# Patient Record
Sex: Male | Born: 1995 | Race: Asian | Hispanic: No | Marital: Single | Smoking: Never smoker
Health system: Southern US, Community
[De-identification: ages and names within clinical notes are randomized; demographics above are authoritative.]

---
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
## 2014-08-26 IMAGING — CR DG FOREARM 2V*R*
2 series · 2 of 2 positions shown · non-contrast
Comparison: None.

CLINICAL DATA: Extremity laceration. Evaluate for foreign body.

EXAM:
RIGHT FOREARM - 2 VIEW

[x forearm ap right]
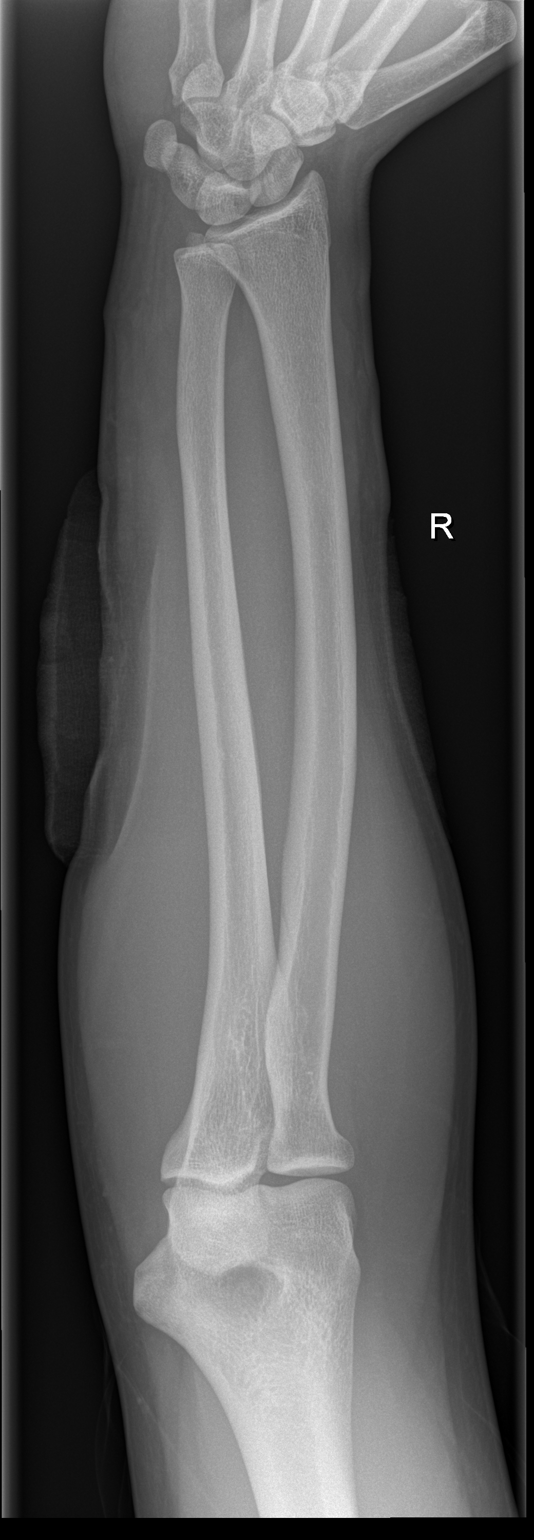

[x forearm lat right]
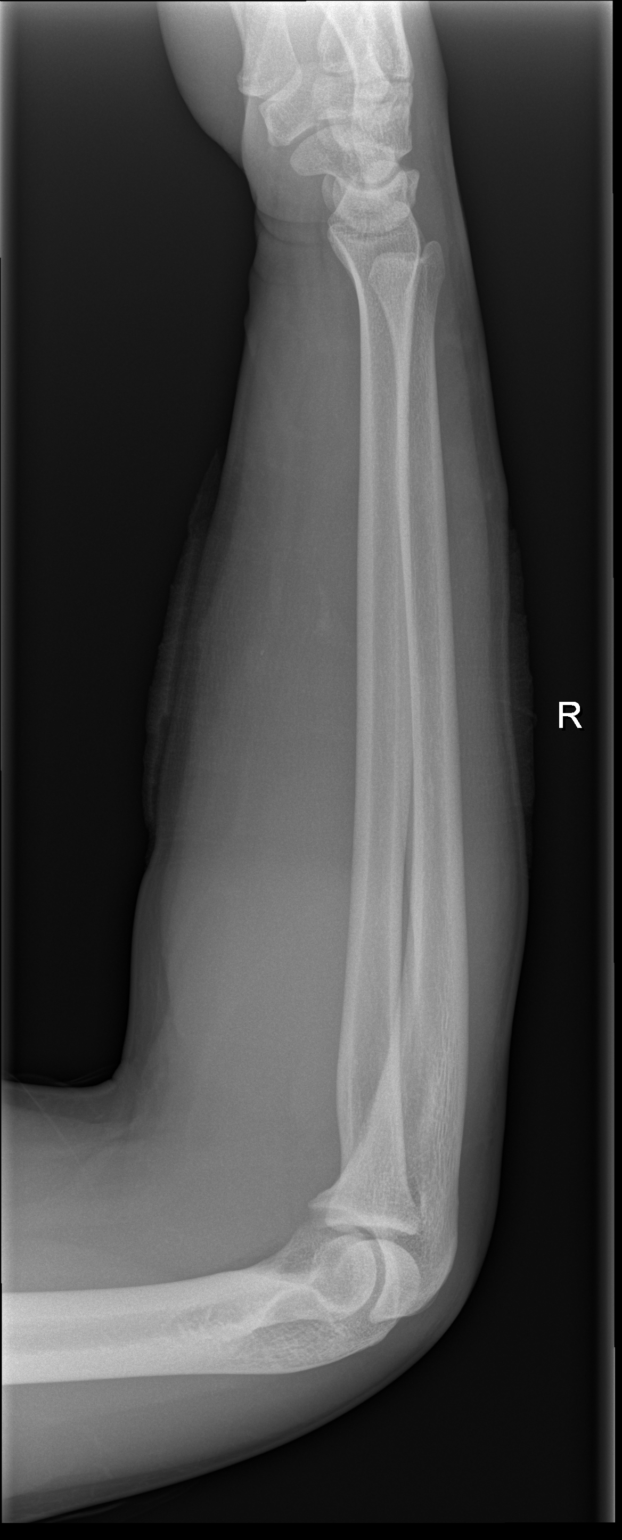

[2 of 2 positions shown; findings below may reference images not displayed]

FINDINGS: There is a 2 mm radiodense foreign body central to the bandage,
along the ventral mid forearm. It is located 2 mm deep to the skin
surface. Negative osseous structures.
IMPRESSION: 2 mm foreign body in the superficial mid forearm, as above.

Negative osseous structures.
